# Patient Record
Sex: Female | Born: 1955 | Race: White | Hispanic: No | Marital: Married | Smoking: Former smoker
Health system: Southern US, Community
[De-identification: ages and names within clinical notes are randomized; demographics above are authoritative.]

## PROBLEM LIST (undated history)

## (undated) DIAGNOSIS — K219 Gastro-esophageal reflux disease without esophagitis: Secondary | ICD-10-CM

## (undated) DIAGNOSIS — R112 Nausea with vomiting, unspecified: Secondary | ICD-10-CM

## (undated) DIAGNOSIS — Z8719 Personal history of other diseases of the digestive system: Secondary | ICD-10-CM

## (undated) DIAGNOSIS — Z9889 Other specified postprocedural states: Secondary | ICD-10-CM

## (undated) DIAGNOSIS — C801 Malignant (primary) neoplasm, unspecified: Secondary | ICD-10-CM

## (undated) HISTORY — PX: BUNIONECTOMY: SHX129

## (undated) HISTORY — PX: TUBAL LIGATION: SHX77

## (undated) HISTORY — PX: ABDOMINAL HYSTERECTOMY: SHX81

---

## 1985-02-27 DIAGNOSIS — C801 Malignant (primary) neoplasm, unspecified: Secondary | ICD-10-CM

## 1985-02-27 HISTORY — DX: Malignant (primary) neoplasm, unspecified: C80.1

## 2008-10-19 ENCOUNTER — Emergency Department (HOSPITAL_COMMUNITY): Admission: EM | Admit: 2008-10-19 | Discharge: 2008-10-19 | Payer: Self-pay | Admitting: Emergency Medicine

## 2010-03-16 ENCOUNTER — Encounter
Admission: RE | Admit: 2010-03-16 | Discharge: 2010-03-16 | Payer: Self-pay | Source: Home / Self Care | Attending: Specialist | Admitting: Specialist

## 2010-04-04 ENCOUNTER — Other Ambulatory Visit: Payer: Self-pay | Admitting: Specialist

## 2010-04-04 DIAGNOSIS — R928 Other abnormal and inconclusive findings on diagnostic imaging of breast: Secondary | ICD-10-CM

## 2010-04-08 ENCOUNTER — Ambulatory Visit
Admission: RE | Admit: 2010-04-08 | Discharge: 2010-04-08 | Disposition: A | Discharge status: Home / Self Care | Payer: Commercial Managed Care - PPO | Source: Ambulatory Visit | Attending: Specialist | Admitting: Specialist

## 2010-04-08 DIAGNOSIS — R928 Other abnormal and inconclusive findings on diagnostic imaging of breast: Secondary | ICD-10-CM

## 2014-09-04 ENCOUNTER — Encounter (INDEPENDENT_AMBULATORY_CARE_PROVIDER_SITE_OTHER): Payer: Self-pay | Admitting: *Deleted

## 2014-09-17 ENCOUNTER — Other Ambulatory Visit (INDEPENDENT_AMBULATORY_CARE_PROVIDER_SITE_OTHER): Payer: Self-pay | Admitting: *Deleted

## 2014-09-17 ENCOUNTER — Encounter (INDEPENDENT_AMBULATORY_CARE_PROVIDER_SITE_OTHER): Payer: Self-pay | Admitting: *Deleted

## 2014-09-17 DIAGNOSIS — Z1211 Encounter for screening for malignant neoplasm of colon: Secondary | ICD-10-CM

## 2014-10-29 ENCOUNTER — Telehealth (INDEPENDENT_AMBULATORY_CARE_PROVIDER_SITE_OTHER): Payer: Self-pay | Admitting: *Deleted

## 2014-10-29 MED ORDER — SUPREP BOWEL PREP KIT 17.5-3.13-1.6 GM/177ML PO SOLN: 1.0000 | Freq: Once | ORAL | Status: DC

## 2014-10-29 NOTE — Telephone Encounter (Signed)
Patient needs suprep 

## 2014-11-17 ENCOUNTER — Telehealth (INDEPENDENT_AMBULATORY_CARE_PROVIDER_SITE_OTHER): Payer: Self-pay | Admitting: *Deleted

## 2014-11-17 NOTE — Telephone Encounter (Signed)
Referring MD/PCP: Macarthur Critchley   Procedure: tcs  Reason/Indication:  screening  Has patient had this procedure before?  no  If so, when, by whom and where?    Is there a family history of colon cancer?  no  Who?  What age when diagnosed?    Is patient diabetic?   no      Does patient have prosthetic heart valve?  no  Do you have a pacemaker?  no  Has patient ever had endocarditis? no  Has patient had joint replacement within last 12 months?  no  Does patient tend to be constipated or take laxatives? sometime  Does patient have a history of alcohol/drug use? no  Is patient on Coumadin, Plavix and/or Aspirin? no  Medications: fluticasone nasal spray daily, amiben 5 mg 1/2 tab nightly, zantac 150 mg bid, stool softener nightly, claritin 10 mg daily, advil prn, omega red krill oil daily, red yeast rice bid  Allergies: cholesterol medications, N-SAIDS  Medication Adjustment:   Procedure date & time: 12/09/14 at 930

## 2014-11-17 NOTE — Telephone Encounter (Signed)
agree

## 2014-12-09 ENCOUNTER — Ambulatory Visit (HOSPITAL_COMMUNITY)
Admission: RE | Admit: 2014-12-09 | Discharge: 2014-12-09 | Disposition: A | Discharge status: Home / Self Care | Payer: 59 | Source: Ambulatory Visit | Attending: Internal Medicine | Admitting: Internal Medicine

## 2014-12-09 ENCOUNTER — Encounter (HOSPITAL_COMMUNITY): Payer: Self-pay | Admitting: *Deleted

## 2014-12-09 ENCOUNTER — Encounter (HOSPITAL_COMMUNITY)
Admission: RE | Disposition: A | Discharge status: Home / Self Care | Payer: Self-pay | Source: Ambulatory Visit | Attending: Internal Medicine

## 2014-12-09 DIAGNOSIS — Z79899 Other long term (current) drug therapy: Secondary | ICD-10-CM | POA: Insufficient documentation

## 2014-12-09 DIAGNOSIS — Z1211 Encounter for screening for malignant neoplasm of colon: Secondary | ICD-10-CM | POA: Insufficient documentation

## 2014-12-09 DIAGNOSIS — K648 Other hemorrhoids: Secondary | ICD-10-CM | POA: Insufficient documentation

## 2014-12-09 DIAGNOSIS — K9171 Accidental puncture and laceration of a digestive system organ or structure during a digestive system procedure: Secondary | ICD-10-CM | POA: Diagnosis not present

## 2014-12-09 DIAGNOSIS — K219 Gastro-esophageal reflux disease without esophagitis: Secondary | ICD-10-CM | POA: Diagnosis not present

## 2014-12-09 DIAGNOSIS — Z809 Family history of malignant neoplasm, unspecified: Secondary | ICD-10-CM | POA: Diagnosis not present

## 2014-12-09 DIAGNOSIS — K644 Residual hemorrhoidal skin tags: Secondary | ICD-10-CM | POA: Diagnosis not present

## 2014-12-09 DIAGNOSIS — Z87891 Personal history of nicotine dependence: Secondary | ICD-10-CM | POA: Diagnosis not present

## 2014-12-09 HISTORY — DX: Personal history of other diseases of the digestive system: Z87.19

## 2014-12-09 HISTORY — DX: Other specified postprocedural states: Z98.890

## 2014-12-09 HISTORY — PX: COLONOSCOPY: SHX5424

## 2014-12-09 HISTORY — DX: Gastro-esophageal reflux disease without esophagitis: K21.9

## 2014-12-09 HISTORY — DX: Malignant (primary) neoplasm, unspecified: C80.1

## 2014-12-09 HISTORY — DX: Nausea with vomiting, unspecified: R11.2

## 2014-12-09 SURGERY — COLONOSCOPY
Anesthesia: Moderate Sedation

## 2014-12-09 MED ORDER — MIDAZOLAM HCL 5 MG/5ML IJ SOLN
INTRAMUSCULAR | Status: DC | PRN
  Administered 2014-12-09: 3 mg via INTRAVENOUS
  Administered 2014-12-09 (×3): 2 mg via INTRAVENOUS
  Administered 2014-12-09: 1 mg via INTRAVENOUS

## 2014-12-09 MED ORDER — MIDAZOLAM HCL 5 MG/5ML IJ SOLN
INTRAMUSCULAR | Status: AC
  Filled 2014-12-09: qty 10

## 2014-12-09 MED ORDER — MEPERIDINE HCL 50 MG/ML IJ SOLN
INTRAMUSCULAR | Status: DC | PRN
  Administered 2014-12-09 (×2): 25 mg via INTRAVENOUS

## 2014-12-09 MED ORDER — SODIUM CHLORIDE 0.9 % IV SOLN
INTRAVENOUS | Status: DC
  Administered 2014-12-09: 1000 mL via INTRAVENOUS

## 2014-12-09 MED ORDER — MEPERIDINE HCL 50 MG/ML IJ SOLN
INTRAMUSCULAR | Status: AC
  Filled 2014-12-09: qty 1

## 2014-12-09 MED ORDER — STERILE WATER FOR IRRIGATION IR SOLN
Status: DC | PRN
  Administered 2014-12-09: 09:00:00

## 2014-12-09 NOTE — Op Note (Signed)
COLONOSCOPY PROCEDURE REPORT  PATIENT:  Ashley Mccoy  MR#:  [id number] Birthdate:  1955-10-08, 59 y.o., female Endoscopist:  Dr. Rogene Houston, MD Referred By:  Dr. Earney Mallet, MD Procedure Date: 12/09/2014  Procedure:   Colonoscopy  Indications:  Patient is 59 year old Caucasian female was undergoing average risk screening colonoscopy.  Informed Consent:  The procedure and risks were reviewed with the patient and informed consent was obtained.  Medications:  Demerol 50 mg IV Versed 10 mg IV  Description of procedure:  After a digital rectal exam was performed, that colonoscope was advanced from the anus through the rectum and colon to the area of the cecum, ileocecal valve and appendiceal orifice. The cecum was deeply intubated. These structures were well-seen and photographed for the record. From the level of the cecum and ileocecal valve, the scope was slowly and cautiously withdrawn. The mucosal surfaces were carefully surveyed utilizing scope tip to flexion to facilitate fold flattening as needed. The scope was pulled down into the rectum where a thorough exam including retroflexion was performed.  Findings:   Prep satisfactory. Normal mucosa of cecum. Focal mucosal injury to cecal mucosa secondary to suction with oozing which stopped with pressure him cold biopsy forceps for three minutes. Normal mucosa of rest of the colon and rectum. Small hemorrhoids above and below the dentate line.    Therapeutic/Diagnostic Maneuvers Performed:  None  Complications:  None  EBL: minimal  Cecal Withdrawal Time:  18 minutes  Impression:  Examination performed to cecum. Small internal and external hemorrhoids otherwise normal colonoscopy. Focal suction injury to cecal mucosa which did not require any intervention.  Recommendations:  Standard instructions given. No aspirin or NSAIDs for 3 days. Next screening exam in 10 years  REHMAN,NAJEEB U  12/09/2014 10:06 AM  CC:  Dr. Earney Mallet, MD & Dr. Rayne Du ref. provider found

## 2014-12-09 NOTE — Discharge Instructions (Signed)
No aspirin or NSAIDs for 3 days. Resume usual medications and diet. No driving for 24 hours. Next screening exam in 10 years.     Colonoscopy, Care After These instructions give you information on caring for yourself after your procedure. Your doctor may also give you more specific instructions. Call your doctor if you have any problems or questions after your procedure. HOME CARE  Do not drive for 24 hours.  Do not sign important papers or use machinery for 24 hours.  You may shower.  You may go back to your usual activities, but go slower for the first 24 hours.  Take rest breaks often during the first 24 hours.  Walk around or use warm packs on your belly (abdomen) if you have belly cramping or gas.  Drink enough fluids to keep your pee (urine) clear or pale yellow.  Resume your normal diet. Avoid heavy or fried foods.  Avoid drinking alcohol for 24 hours or as told by your doctor.  Only take medicines as told by your doctor. If a tissue sample (biopsy) was taken during the procedure:   Do not take aspirin or blood thinners for 7 days, or as told by your doctor.  Do not drink alcohol for 7 days, or as told by your doctor.  Eat soft foods for the first 24 hours. GET HELP IF: You still have a small amount of blood in your poop (stool) 2-3 days after the procedure. GET HELP RIGHT AWAY IF:  You have more than a small amount of blood in your poop.  You see clumps of tissue (blood clots) in your poop.  Your belly is puffy (swollen).  You feel sick to your stomach (nauseous) or throw up (vomit).  You have a fever.  You have belly pain that gets worse and medicine does not help. MAKE SURE YOU:  Understand these instructions.  Will watch your condition.  Will get help right away if you are not doing well or get worse.   This information is not intended to replace advice given to you by your health care provider. Make sure you discuss any questions you have with  your health care provider.   Document Released: 03/18/2010 Document Revised: 02/18/2013 Document Reviewed: 10/21/2012 Elsevier Interactive Patient Education 2016 [employer].   Hemorrhoids Hemorrhoids are swollen veins around the rectum or anus. There are two types of hemorrhoids:   Internal hemorrhoids. These occur in the veins just inside the rectum. They may poke through to the outside and become irritated and painful.  External hemorrhoids. These occur in the veins outside the anus and can be felt as a painful swelling or hard lump near the anus. CAUSES  Pregnancy.   Obesity.   Constipation or diarrhea.   Straining to have a bowel movement.   Sitting for long periods on the toilet.  Heavy lifting or other activity that caused you to strain.  Anal intercourse. SYMPTOMS   Pain.   Anal itching or irritation.   Rectal bleeding.   Fecal leakage.   Anal swelling.   One or more lumps around the anus.  DIAGNOSIS  Your caregiver may be able to diagnose hemorrhoids by visual examination. Other examinations or tests that may be performed include:   Examination of the rectal area with a gloved hand (digital rectal exam).   Examination of anal canal using a small tube (scope).   A blood test if you have lost a significant amount of blood.  A test to look  inside the colon (sigmoidoscopy or colonoscopy). TREATMENT Most hemorrhoids can be treated at home. However, if symptoms do not seem to be getting better or if you have a lot of rectal bleeding, your caregiver may perform a procedure to help make the hemorrhoids get smaller or remove them completely. Possible treatments include:   Placing a rubber band at the base of the hemorrhoid to cut off the circulation (rubber band ligation).   Injecting a chemical to shrink the hemorrhoid (sclerotherapy).   Using a tool to burn the hemorrhoid (infrared light therapy).   Surgically removing the hemorrhoid  (hemorrhoidectomy).   Stapling the hemorrhoid to block blood flow to the tissue (hemorrhoid stapling).  HOME CARE INSTRUCTIONS   Eat foods with fiber, such as whole grains, beans, nuts, fruits, and vegetables. Ask your doctor about taking products with added fiber in them (fibersupplements).  Increase fluid intake. Drink enough water and fluids to keep your urine clear or pale yellow.   Exercise regularly.   Go to the bathroom when you have the urge to have a bowel movement. Do not wait.   Avoid straining to have bowel movements.   Keep the anal area dry and clean. Use wet toilet paper or moist towelettes after a bowel movement.   Medicated creams and suppositories may be used or applied as directed.   Only take over-the-counter or prescription medicines as directed by your caregiver.   Take warm sitz baths for 15-20 minutes, 3-4 times a day to ease pain and discomfort.   Place ice packs on the hemorrhoids if they are tender and swollen. Using ice packs between sitz baths may be helpful.   Put ice in a plastic bag.   Place a towel between your skin and the bag.   Leave the ice on for 15-20 minutes, 3-4 times a day.   Do not use a donut-shaped pillow or sit on the toilet for long periods. This increases blood pooling and pain.  SEEK MEDICAL CARE IF:  You have increasing pain and swelling that is not controlled by treatment or medicine.  You have uncontrolled bleeding.  You have difficulty or you are unable to have a bowel movement.  You have pain or inflammation outside the area of the hemorrhoids. MAKE SURE YOU:  Understand these instructions.  Will watch your condition.  Will get help right away if you are not doing well or get worse.   This information is not intended to replace advice given to you by your health care provider. Make sure you discuss any questions you have with your health care provider.   Document Released: 02/11/2000 Document  Revised: 01/31/2012 Document Reviewed: 12/19/2011 Elsevier Interactive Patient Education [employer].

## 2014-12-09 NOTE — H&P (Signed)
Ashley Mccoy is an 59 y.o. female.   Chief Complaint: Patient is here for colonoscopy. HPI: She is 59 year old Caucasian female who is here for screening colonoscopy. She denies abdominal pain change in her bowel habits or rectal bleeding. She's had problems with constipation for many years is getting results with dietary measures and stool softener. Family history is negative for CRC.  Past Medical History  Diagnosis Date  . PONV (postoperative nausea and vomiting)   . GERD (gastroesophageal reflux disease)   . History of hiatal hernia   . Cancer ([place]) 1987    carcinoma situ of cervix    Past Surgical History  Procedure Laterality Date  . Abdominal hysterectomy    . Tubal ligation    . Bunionectomy Left     and bone spur of 5th digit    Family History  Problem Relation Age of Onset  . Cancer Mother   . Hiatal hernia Daughter    Social History:  reports that she quit smoking about 16 years ago. She does not have any smokeless tobacco history on file. She reports that she drinks alcohol. She reports that she does not use illicit drugs.  Allergies:  Allergies  Allergen Reactions  . Nsaids     Cannot take prescription nsaids--chest pain.  . Statins     Joint pain/stiffness    Medications Prior to Admission  Medication Sig Dispense Refill  . acidophilus (RISAQUAD) CAPS capsule Take 1 capsule by mouth at bedtime.    . B-COMPLEX-C PO Take 1 tablet by mouth daily as needed.    . Cholecalciferol (VITAMIN D3) 5000 UNITS TABS Take 1 tablet by mouth daily.    Marland Kitchen docusate sodium (COLACE) 100 MG capsule Take 100 mg by mouth at bedtime.    . fluticasone (FLONASE) 50 MCG/ACT nasal spray Place 1 spray into both nostrils daily as needed for allergies or rhinitis.    Marland Kitchen loratadine (CLARITIN) 10 MG tablet Take 10 mg by mouth daily.    . ranitidine (ZANTAC) 150 MG tablet Take 300 mg by mouth at bedtime.    Manus Gunning BOWEL PREP SOLN Take 1 kit by mouth once. 1 Bottle 0  . Vitamin D,  Ergocalciferol, (DRISDOL) 50000 UNITS CAPS capsule Take 1 capsule by mouth once a week. Takes on Saturday  0  . zolpidem (AMBIEN) 5 MG tablet Take 2.5 mg by mouth 2 (two) times a week.    . magnesium gluconate (MAGONATE) 500 MG tablet Take 500 mg by mouth daily as needed (weekly).    . triamcinolone cream (KENALOG) 0.5 % Apply 1 application topically 2 (two) times daily as needed.  0    No results found for this or any previous visit (from the past 48 hour(s)). No results found.  ROS  Blood pressure 124/75, pulse 80, temperature 97.8 F (36.6 C), temperature source Oral, resp. rate 14, height '5\' 8"'  (1.727 m), weight 169 lb (76.658 kg), SpO2 99 %. Physical Exam  Constitutional: She appears well-developed and well-nourished.  HENT:  Mouth/Throat: Oropharynx is clear and moist.  Eyes: Conjunctivae are normal. No scleral icterus.  Neck: No thyromegaly present.  Cardiovascular: Normal rate, regular rhythm and normal heart sounds.   No murmur heard. Respiratory: Effort normal and breath sounds normal.  GI: Soft. She exhibits no distension and no mass. There is no tenderness.  Musculoskeletal: She exhibits no edema.  Lymphadenopathy:    She has no cervical adenopathy.  Neurological: She is alert.  Skin: Skin is warm and  dry.     Assessment/Plan Average risk screening colonoscopy.  Ashley Mccoy U 12/09/2014, 9:23 AM

## 2014-12-15 ENCOUNTER — Encounter (HOSPITAL_COMMUNITY): Payer: Self-pay | Admitting: Internal Medicine

## 2015-08-03 DIAGNOSIS — E785 Hyperlipidemia, unspecified: Secondary | ICD-10-CM | POA: Diagnosis not present

## 2015-08-03 DIAGNOSIS — Z0001 Encounter for general adult medical examination with abnormal findings: Secondary | ICD-10-CM | POA: Diagnosis not present

## 2015-08-03 DIAGNOSIS — K219 Gastro-esophageal reflux disease without esophagitis: Secondary | ICD-10-CM | POA: Diagnosis not present

## 2015-08-03 DIAGNOSIS — F5104 Psychophysiologic insomnia: Secondary | ICD-10-CM | POA: Diagnosis not present

## 2015-08-03 DIAGNOSIS — N390 Urinary tract infection, site not specified: Secondary | ICD-10-CM | POA: Diagnosis not present

## 2015-10-19 DIAGNOSIS — D069 Carcinoma in situ of cervix, unspecified: Secondary | ICD-10-CM | POA: Diagnosis not present

## 2015-10-19 DIAGNOSIS — Z1231 Encounter for screening mammogram for malignant neoplasm of breast: Secondary | ICD-10-CM | POA: Diagnosis not present

## 2015-10-19 DIAGNOSIS — M85851 Other specified disorders of bone density and structure, right thigh: Secondary | ICD-10-CM | POA: Diagnosis not present

## 2015-10-19 DIAGNOSIS — Z87410 Personal history of cervical dysplasia: Secondary | ICD-10-CM | POA: Diagnosis not present

## 2015-10-19 DIAGNOSIS — Z01419 Encounter for gynecological examination (general) (routine) without abnormal findings: Secondary | ICD-10-CM | POA: Diagnosis not present

## 2015-12-30 DIAGNOSIS — D225 Melanocytic nevi of trunk: Secondary | ICD-10-CM | POA: Diagnosis not present

## 2015-12-30 DIAGNOSIS — L820 Inflamed seborrheic keratosis: Secondary | ICD-10-CM | POA: Diagnosis not present

## 2016-04-17 ENCOUNTER — Encounter (HOSPITAL_COMMUNITY)
Admission: EM | Disposition: A | Discharge status: Home / Self Care | Payer: Self-pay | Source: Home / Self Care | Attending: Emergency Medicine

## 2016-04-17 ENCOUNTER — Encounter (HOSPITAL_COMMUNITY): Payer: Self-pay

## 2016-04-17 ENCOUNTER — Emergency Department (HOSPITAL_COMMUNITY)
Admission: EM | Admit: 2016-04-17 | Discharge: 2016-04-17 | Disposition: A | Discharge status: Home / Self Care | Payer: 59 | Attending: General Surgery | Admitting: General Surgery

## 2016-04-17 ENCOUNTER — Emergency Department (HOSPITAL_COMMUNITY): Payer: 59 | Admitting: Anesthesiology

## 2016-04-17 ENCOUNTER — Emergency Department (HOSPITAL_COMMUNITY): Payer: 59

## 2016-04-17 DIAGNOSIS — Z7951 Long term (current) use of inhaled steroids: Secondary | ICD-10-CM | POA: Diagnosis not present

## 2016-04-17 DIAGNOSIS — Z8379 Family history of other diseases of the digestive system: Secondary | ICD-10-CM | POA: Insufficient documentation

## 2016-04-17 DIAGNOSIS — Z8541 Personal history of malignant neoplasm of cervix uteri: Secondary | ICD-10-CM | POA: Insufficient documentation

## 2016-04-17 DIAGNOSIS — Z79899 Other long term (current) drug therapy: Secondary | ICD-10-CM | POA: Insufficient documentation

## 2016-04-17 DIAGNOSIS — Z87891 Personal history of nicotine dependence: Secondary | ICD-10-CM | POA: Diagnosis not present

## 2016-04-17 DIAGNOSIS — K449 Diaphragmatic hernia without obstruction or gangrene: Secondary | ICD-10-CM | POA: Insufficient documentation

## 2016-04-17 DIAGNOSIS — Z90710 Acquired absence of both cervix and uterus: Secondary | ICD-10-CM | POA: Diagnosis not present

## 2016-04-17 DIAGNOSIS — Z886 Allergy status to analgesic agent status: Secondary | ICD-10-CM | POA: Diagnosis not present

## 2016-04-17 DIAGNOSIS — Z888 Allergy status to other drugs, medicaments and biological substances status: Secondary | ICD-10-CM | POA: Diagnosis not present

## 2016-04-17 DIAGNOSIS — R1110 Vomiting, unspecified: Secondary | ICD-10-CM | POA: Diagnosis not present

## 2016-04-17 DIAGNOSIS — K353 Acute appendicitis with localized peritonitis, without perforation or gangrene: Secondary | ICD-10-CM

## 2016-04-17 DIAGNOSIS — K3580 Unspecified acute appendicitis: Secondary | ICD-10-CM | POA: Diagnosis not present

## 2016-04-17 DIAGNOSIS — K219 Gastro-esophageal reflux disease without esophagitis: Secondary | ICD-10-CM | POA: Diagnosis not present

## 2016-04-17 HISTORY — PX: LAPAROSCOPIC APPENDECTOMY: SHX408

## 2016-04-17 LAB — COMPREHENSIVE METABOLIC PANEL
ALT: 22 U/L (ref 14–54)
AST: 23 U/L (ref 15–41)
Albumin: 4.4 g/dL (ref 3.5–5.0)
Alkaline Phosphatase: 51 U/L (ref 38–126)
Anion gap: 10 (ref 5–15)
BUN: 15 mg/dL (ref 6–20)
CO2: 26 mmol/L (ref 22–32)
Calcium: 9.6 mg/dL (ref 8.9–10.3)
Chloride: 99 mmol/L — ABNORMAL LOW (ref 101–111)
Creatinine, Ser: 0.55 mg/dL (ref 0.44–1.00)
GFR calc Af Amer: >60 mL/min (ref >60)
GFR calc non Af Amer: >60 mL/min (ref >60)
Glucose, Bld: 133 mg/dL — ABNORMAL HIGH (ref 65–99)
Potassium: 3.6 mmol/L (ref 3.5–5.1)
Sodium: 135 mmol/L (ref 135–145)
Total Bilirubin: 0.8 mg/dL (ref 0.3–1.2)
Total Protein: 8 g/dL (ref 6.5–8.1)

## 2016-04-17 LAB — URINALYSIS, ROUTINE W REFLEX MICROSCOPIC
Bilirubin Urine: NEGATIVE
Glucose, UA: NEGATIVE mg/dL
Ketones, ur: NEGATIVE mg/dL
Leukocytes, UA: NEGATIVE
Nitrite: NEGATIVE
Protein, ur: NEGATIVE mg/dL
Specific Gravity, Urine: 1.013 (ref 1.005–1.030)
pH: 8 (ref 5.0–8.0)

## 2016-04-17 LAB — CBC WITH DIFFERENTIAL/PLATELET
BASOS PCT: 0 %
Basophils Absolute: 0 10*3/uL (ref 0.0–0.1)
EOS ABS: 0 10*3/uL (ref 0.0–0.7)
EOS PCT: 0 %
HCT: 40.3 % (ref 36.0–46.0)
Hemoglobin: 13.7 g/dL (ref 12.0–15.0)
LYMPHS ABS: 1 10*3/uL (ref 0.7–4.0)
Lymphocytes Relative: 7 %
MCH: 31.1 pg (ref 26.0–34.0)
MCHC: 34 g/dL (ref 30.0–36.0)
MCV: 91.4 fL (ref 78.0–100.0)
Monocytes Absolute: 0.8 10*3/uL (ref 0.1–1.0)
Monocytes Relative: 6 %
Neutro Abs: 12.1 10*3/uL — ABNORMAL HIGH (ref 1.7–7.7)
Neutrophils Relative %: 87 %
PLATELETS: 306 10*3/uL (ref 150–400)
RBC: 4.41 MIL/uL (ref 3.87–5.11)
RDW: 13.3 % (ref 11.5–15.5)
WBC: 13.8 10*3/uL — AB (ref 4.0–10.5)

## 2016-04-17 LAB — LIPASE, BLOOD: Lipase: 15 U/L (ref 11–51)

## 2016-04-17 SURGERY — APPENDECTOMY, LAPAROSCOPIC
Anesthesia: General

## 2016-04-17 MED ORDER — NEOSTIGMINE METHYLSULFATE 10 MG/10ML IV SOLN
INTRAVENOUS | Status: DC | PRN
  Administered 2016-04-17: 3 mg via INTRAVENOUS

## 2016-04-17 MED ORDER — SUCCINYLCHOLINE 20MG/ML (10ML) SYRINGE FOR MEDFUSION PUMP - OPTIME
INTRAMUSCULAR | Status: DC | PRN
  Administered 2016-04-17: 80 mg via INTRAVENOUS

## 2016-04-17 MED ORDER — BUPIVACAINE HCL (PF) 0.5 % IJ SOLN
INTRAMUSCULAR | Status: DC | PRN
  Administered 2016-04-17: 10 mL

## 2016-04-17 MED ORDER — DEXAMETHASONE SODIUM PHOSPHATE 4 MG/ML IJ SOLN
INTRAMUSCULAR | Status: AC
  Filled 2016-04-17: qty 1

## 2016-04-17 MED ORDER — FENTANYL CITRATE (PF) 100 MCG/2ML IJ SOLN
INTRAMUSCULAR | Status: DC | PRN
  Administered 2016-04-17 (×3): 50 ug via INTRAVENOUS

## 2016-04-17 MED ORDER — LACTATED RINGERS IV SOLN
INTRAVENOUS | Status: DC
  Administered 2016-04-17: 1000 mL via INTRAVENOUS

## 2016-04-17 MED ORDER — HYDROCODONE-ACETAMINOPHEN 5-325 MG PO TABS: 1.0000 | ORAL_TABLET | ORAL | 0 refills | Status: AC | PRN

## 2016-04-17 MED ORDER — GLYCOPYRROLATE 0.2 MG/ML IJ SOLN
INTRAMUSCULAR | Status: AC
  Filled 2016-04-17: qty 1

## 2016-04-17 MED ORDER — POVIDONE-IODINE 10 % OINT PACKET
TOPICAL_OINTMENT | CUTANEOUS | Status: DC | PRN
  Administered 2016-04-17: 1 via TOPICAL

## 2016-04-17 MED ORDER — CHLORHEXIDINE GLUCONATE CLOTH 2 % EX PADS: 6.0000 | MEDICATED_PAD | Freq: Once | CUTANEOUS | Status: DC

## 2016-04-17 MED ORDER — SODIUM CHLORIDE 0.9 % IR SOLN
Status: DC | PRN
  Administered 2016-04-17: 1000 mL

## 2016-04-17 MED ORDER — SUCCINYLCHOLINE CHLORIDE 20 MG/ML IJ SOLN
INTRAMUSCULAR | Status: AC
  Filled 2016-04-17: qty 1

## 2016-04-17 MED ORDER — FAMOTIDINE IN NACL 20-0.9 MG/50ML-% IV SOLN: 20.0000 mg | INTRAVENOUS | Status: DC

## 2016-04-17 MED ORDER — IOPAMIDOL (ISOVUE-300) INJECTION 61%
100.0000 mL | Freq: Once | INTRAVENOUS | Status: AC | PRN
  Administered 2016-04-17: 100 mL via INTRAVENOUS

## 2016-04-17 MED ORDER — IOPAMIDOL (ISOVUE-300) INJECTION 61%
INTRAVENOUS | Status: AC
  Filled 2016-04-17: qty 30

## 2016-04-17 MED ORDER — ONDANSETRON HCL 4 MG/2ML IJ SOLN
INTRAMUSCULAR | Status: AC
  Filled 2016-04-17: qty 2

## 2016-04-17 MED ORDER — MIDAZOLAM HCL 2 MG/2ML IJ SOLN
INTRAMUSCULAR | Status: AC
  Filled 2016-04-17: qty 2

## 2016-04-17 MED ORDER — LIDOCAINE HCL (PF) 1 % IJ SOLN
INTRAMUSCULAR | Status: AC
Start: 2016-04-17 — End: 2016-04-17
  Filled 2016-04-17: qty 5

## 2016-04-17 MED ORDER — FENTANYL CITRATE (PF) 100 MCG/2ML IJ SOLN
INTRAMUSCULAR | Status: AC
  Filled 2016-04-17: qty 4

## 2016-04-17 MED ORDER — BUPIVACAINE HCL (PF) 0.5 % IJ SOLN
INTRAMUSCULAR | Status: AC
  Filled 2016-04-17: qty 30

## 2016-04-17 MED ORDER — ONDANSETRON HCL 4 MG/2ML IJ SOLN
4.0000 mg | Freq: Once | INTRAMUSCULAR | Status: AC
  Administered 2016-04-17: 4 mg via INTRAVENOUS

## 2016-04-17 MED ORDER — MIDAZOLAM HCL 2 MG/2ML IJ SOLN
1.0000 mg | INTRAMUSCULAR | Status: AC
  Administered 2016-04-17: 2 mg via INTRAVENOUS

## 2016-04-17 MED ORDER — GLYCOPYRROLATE 0.2 MG/ML IJ SOLN
INTRAMUSCULAR | Status: AC
  Filled 2016-04-17: qty 2

## 2016-04-17 MED ORDER — SCOPOLAMINE 1 MG/3DAYS TD PT72
MEDICATED_PATCH | TRANSDERMAL | Status: AC
  Filled 2016-04-17: qty 1

## 2016-04-17 MED ORDER — HYDROMORPHONE HCL 1 MG/ML IJ SOLN: 0.2500 mg | INTRAMUSCULAR | Status: DC | PRN

## 2016-04-17 MED ORDER — ERTAPENEM SODIUM 1 G IJ SOLR
INTRAMUSCULAR | Status: AC
  Filled 2016-04-17: qty 1

## 2016-04-17 MED ORDER — POVIDONE-IODINE 10 % EX OINT
TOPICAL_OINTMENT | CUTANEOUS | Status: AC
  Filled 2016-04-17: qty 1

## 2016-04-17 MED ORDER — PROPOFOL 10 MG/ML IV BOLUS
INTRAVENOUS | Status: AC
  Filled 2016-04-17: qty 20

## 2016-04-17 MED ORDER — ONDANSETRON HCL 4 MG/2ML IJ SOLN
4.0000 mg | Freq: Once | INTRAMUSCULAR | Status: AC
  Administered 2016-04-17: 4 mg via INTRAVENOUS
  Filled 2016-04-17: qty 2

## 2016-04-17 MED ORDER — LIDOCAINE HCL (CARDIAC) 10 MG/ML IV SOLN
INTRAVENOUS | Status: DC | PRN
  Administered 2016-04-17: 40 mg via INTRAVENOUS

## 2016-04-17 MED ORDER — SODIUM CHLORIDE 0.9 % IV SOLN
1.0000 g | INTRAVENOUS | Status: AC
  Administered 2016-04-17: 1 g via INTRAVENOUS
  Administered 2016-04-17: 13:00:00 via INTRAVENOUS

## 2016-04-17 MED ORDER — ROCURONIUM BROMIDE 50 MG/5ML IV SOLN
INTRAVENOUS | Status: AC
  Filled 2016-04-17: qty 1

## 2016-04-17 MED ORDER — HYDROMORPHONE HCL 1 MG/ML IJ SOLN
1.0000 mg | Freq: Once | INTRAMUSCULAR | Status: AC
  Administered 2016-04-17: 1 mg via INTRAVENOUS
  Filled 2016-04-17: qty 1

## 2016-04-17 MED ORDER — SCOPOLAMINE 1 MG/3DAYS TD PT72
1.0000 | MEDICATED_PATCH | Freq: Once | TRANSDERMAL | Status: DC
  Administered 2016-04-17: 1.5 mg via TRANSDERMAL

## 2016-04-17 MED ORDER — ROCURONIUM 10MG/ML (10ML) SYRINGE FOR MEDFUSION PUMP - OPTIME
INTRAVENOUS | Status: DC | PRN
  Administered 2016-04-17: 25 mg via INTRAVENOUS

## 2016-04-17 MED ORDER — PROPOFOL 10 MG/ML IV BOLUS
INTRAVENOUS | Status: DC | PRN
  Administered 2016-04-17: 150 mg via INTRAVENOUS

## 2016-04-17 MED ORDER — GLYCOPYRROLATE 0.2 MG/ML IJ SOLN
INTRAMUSCULAR | Status: DC | PRN
  Administered 2016-04-17: .6 mg via INTRAVENOUS

## 2016-04-17 MED ORDER — DEXAMETHASONE SODIUM PHOSPHATE 4 MG/ML IJ SOLN
4.0000 mg | Freq: Once | INTRAMUSCULAR | Status: AC
  Administered 2016-04-17: 4 mg via INTRAVENOUS

## 2016-04-17 SURGICAL SUPPLY — 41 items
BAG HAMPER (MISCELLANEOUS) ×3 IMPLANT
CHLORAPREP W/TINT 26ML (MISCELLANEOUS) ×3 IMPLANT
CLOTH BEACON ORANGE TIMEOUT ST (SAFETY) ×3 IMPLANT
COVER LIGHT HANDLE STERIS (MISCELLANEOUS) ×6 IMPLANT
CUTTER FLEX LINEAR 45M (STAPLE) ×3 IMPLANT
DECANTER SPIKE VIAL GLASS SM (MISCELLANEOUS) ×3 IMPLANT
ELECT REM PT RETURN 9FT ADLT (ELECTROSURGICAL) ×3
ELECTRODE REM PT RTRN 9FT ADLT (ELECTROSURGICAL) ×1 IMPLANT
EVACUATOR SMOKE 8.L (FILTER) ×3 IMPLANT
FORMALIN 10 PREFIL 120ML (MISCELLANEOUS) ×3 IMPLANT
GLOVE BIOGEL M 7.0 STRL (GLOVE) ×3 IMPLANT
GLOVE BIOGEL PI IND STRL 7.0 (GLOVE) ×3 IMPLANT
GLOVE BIOGEL PI INDICATOR 7.0 (GLOVE) ×6
GLOVE SURG SS PI 7.5 STRL IVOR (GLOVE) ×3 IMPLANT
GOWN STRL REUS W/ TWL XL LVL3 (GOWN DISPOSABLE) ×1 IMPLANT
GOWN STRL REUS W/TWL LRG LVL3 (GOWN DISPOSABLE) ×3 IMPLANT
GOWN STRL REUS W/TWL XL LVL3 (GOWN DISPOSABLE) ×2
INST SET LAPROSCOPIC AP (KITS) ×3 IMPLANT
KIT ROOM TURNOVER APOR (KITS) ×3 IMPLANT
MANIFOLD NEPTUNE II (INSTRUMENTS) ×3 IMPLANT
NEEDLE INSUFFLATION 14GA 120MM (NEEDLE) ×3 IMPLANT
NS IRRIG 1000ML POUR BTL (IV SOLUTION) ×3 IMPLANT
PACK LAP CHOLE LZT030E (CUSTOM PROCEDURE TRAY) ×3 IMPLANT
PAD ARMBOARD 7.5X6 YLW CONV (MISCELLANEOUS) ×3 IMPLANT
PENCIL HANDSWITCHING (ELECTRODE) ×3 IMPLANT
POUCH SPECIMEN RETRIEVAL 10MM (ENDOMECHANICALS) ×3 IMPLANT
RELOAD STAPLE TA45 3.5 REG BLU (ENDOMECHANICALS) ×3 IMPLANT
SET BASIN LINEN APH (SET/KITS/TRAYS/PACK) ×3 IMPLANT
SHEARS HARMONIC ACE PLUS 36CM (ENDOMECHANICALS) ×3 IMPLANT
SPONGE GAUZE 2X2 8PLY STER LF (GAUZE/BANDAGES/DRESSINGS) ×3
SPONGE GAUZE 2X2 8PLY STRL LF (GAUZE/BANDAGES/DRESSINGS) ×6 IMPLANT
STAPLER VISISTAT (STAPLE) ×3 IMPLANT
SUT VICRYL 0 UR6 27IN ABS (SUTURE) ×3 IMPLANT
TAPE CLOTH SURG 4X10 WHT LF (GAUZE/BANDAGES/DRESSINGS) ×3 IMPLANT
TRAY FOLEY CATH SILVER 16FR (SET/KITS/TRAYS/PACK) ×3 IMPLANT
TROCAR ENDO BLADELESS 11MM (ENDOMECHANICALS) ×3 IMPLANT
TROCAR ENDO BLADELESS 12MM (ENDOMECHANICALS) ×3 IMPLANT
TROCAR XCEL NON-BLD 5MMX100MML (ENDOMECHANICALS) ×3 IMPLANT
TUBING INSUFFLATION (TUBING) ×3 IMPLANT
WARMER LAPAROSCOPE (MISCELLANEOUS) ×3 IMPLANT
YANKAUER SUCT 12FT TUBE ARGYLE (SUCTIONS) ×3 IMPLANT

## 2016-04-17 NOTE — Anesthesia Procedure Notes (Signed)
Procedure Name: Intubation Date/Time: 04/17/2016 12:36 PM Performed by: Tressie Stalker E Pre-anesthesia Checklist: Patient identified, Patient being monitored, Timeout performed, Emergency Drugs available and Suction available Patient Re-evaluated:Patient Re-evaluated prior to inductionOxygen Delivery Method: Circle system utilized Preoxygenation: Pre-oxygenation with 100% oxygen Intubation Type: IV induction Ventilation: Mask ventilation without difficulty Laryngoscope Size: Miller and 2 Grade View: Grade I Tube type: Oral Tube size: 7.0 mm Number of attempts: 1 Airway Equipment and Method: Stylet Placement Confirmation: ETT inserted through vocal cords under direct vision,  positive ETCO2 and breath sounds checked- equal and bilateral Secured at: 21 cm Tube secured with: Tape Dental Injury: Teeth and Oropharynx as per pre-operative assessment

## 2016-04-17 NOTE — ED Notes (Signed)
Patient returned from CT

## 2016-04-17 NOTE — Anesthesia Postprocedure Evaluation (Signed)
Anesthesia Post Note  Patient: Ashley Mccoy  Procedure(s) Performed: Procedure(s) (LRB): APPENDECTOMY LAPAROSCOPIC (N/A)  Patient location during evaluation: PACU Anesthesia Type: General Level of consciousness: awake and alert and oriented Pain management: pain level controlled Vital Signs Assessment: post-procedure vital signs reviewed and stable Respiratory status: spontaneous breathing Cardiovascular status: stable Postop Assessment: no signs of nausea or vomiting Anesthetic complications: no     Last Vitals:  Vitals:   04/17/16 1400 04/17/16 1412  BP: 102/60 (!) 115/57  Pulse: (!) 53 72  Resp: (!) 9 14  Temp:      Last Pain:  Vitals:   04/17/16 1412  TempSrc: Oral  PainSc: 5                  Samanatha Brammer

## 2016-04-17 NOTE — Op Note (Signed)
Patient:  Ashley Mccoy  DOB:  1955/03/01  MRN:  [id number]   Preop Diagnosis:  Acute appendicitis  Postop Diagnosis:  Same  Procedure:  Laparoscopic appendectomy  Surgeon:  Aviva Signs, M.D.  Anes:  General endotracheal  Indications:  Patient is a 61 year old white female who presents with acute appendicitis. The risks and benefits of the procedure including bleeding, infection, and the possibility of an open procedure were fully explained to the patient, who gave informed consent.  Procedure note:  The patient was placed in supine position. After induction of general endotracheal anesthesia, the abdomen was prepped and draped using the usual sterile technique with DuraPrep. Surgical site confirmation was performed.  A supraumbilical incision was made down to the fascia. A Veress needle was introduced into the abdominal cavity and confirmation of placement was done using the saline drop test. The abdomen was then insufflated to 16 mmHg pressure. An 11 mm trocar was introduced into the abdominal cavity under direct visualization without difficulty. The patient was placed in deeper Trendelenburg position and an additional 12 mm trocar was placed in the suprapubic region and a 5 mm trocar was placed in left lower quadrant region. The appendix was visualized and noted to be acutely inflamed. There were some adhesions of small bowel to the anterior abdominal wall in the right lower quadrant that were lysed using the Harmonic scalpel. The mesoappendix was divided using the Harmonic scalpel. A standard Endo GIA was placed across the base the appendix and fired. The appendix was then removed using an Endo Catch bag without difficulty. The staple line was inspected and noted to be within normal limits. All fluid and air were then evacuated from the abdominal cavity prior to the removal of the trochars.  All wounds were irrigated with normal saline. All wounds were injected with 0.5% Sensorcaine.  The supraumbilical fascia was reapproximated using an 0 Vicryl interrupted suture. All skin incisions were closed using staples. Betadine ointment and dry sterile dressings were applied.  All tape and needle counts were correct at the end of procedure. Patient was extubated in the operating room and transferred to PACU in stable condition.  Complications:  None  EBL:  Minimal  Specimen:  Appendix

## 2016-04-17 NOTE — Discharge Instructions (Signed)
Laparoscopic Appendectomy, Adult, Care After °Refer to this sheet in the next few weeks. These instructions provide you with information about caring for yourself after your procedure. Your health care provider may also give you more specific instructions. Your treatment has been planned according to current medical practices, but problems sometimes occur. Call your health care provider if you have any problems or questions after your procedure. °What can I expect after the procedure? °After the procedure, it is common to have: °· A decrease in your energy level. °· Mild pain in the area where the surgical cuts (incisions) were made. °· Constipation. This can be caused by pain medicine and a decrease in your activity. ° °Follow these instructions at home: °Medicines °· Take over-the-counter and prescription medicines only as told by your health care provider. °· Do not drive for 24 hours if you received a sedative. °· Do not drive or operate heavy machinery while taking prescription pain medicine. °· If you were prescribed an antibiotic medicine, take it as told by your health care provider. Do not stop taking the antibiotic even if you start to feel better. °Activity °· For 3 weeks or as long as told by your health care provider: °? Do not lift anything that is heavier than 10 pounds (4.5 kg). °? Do not play contact sports. °· Gradually return to your normal activities. Ask your health care provider what activities are safe for you. °Bathing °· Keep your incisions clean and dry. Clean them as often as told by your health care provider: °? Gently wash the incisions with soap and water. °? Rinse the incisions with water to remove all soap. °? Pat the incisions dry with a clean towel. Do not rub the incisions. °· You may take showers after 48 hours. °· Do not take baths, swim, or use hot tubs for 2 weeks or as told by your health care provider. °Incision care °· Follow instructions from your healthcare provider about  how to take care of your incisions. Make sure you: °? Wash your hands with soap and water before you change your bandage (dressing). If soap and water are not available, use hand sanitizer. °? Change your dressing as told by your health care provider. °? Leave stitches (sutures), skin glue, or adhesive strips in place. These skin closures may need to stay in place for 2 weeks or longer. If adhesive strip edges start to loosen and curl up, you may trim the loose edges. Do not remove adhesive strips completely unless your health care provider tells you to do that. °· Check your incision areas every day for signs of infection. Check for: °? More redness, swelling, or pain. °? More fluid or blood. °? Warmth. °? Pus or a bad smell. °Other Instructions °· If you were sent home with a drain, follow instructions from your health care provider about how to care for the drain and how to empty it. °· Take deep breaths. This helps to prevent your lungs from becoming inflamed. °· To relieve and prevent constipation: °? Drink plenty of fluids. °? Eat plenty of fruits and vegetables. °· Keep all follow-up visits as told by your health care provider. This is important. °Contact a health care provider if: °· You have more redness, swelling, or pain around an incision. °· You have more fluid or blood coming from an incision. °· Your incision feels warm to the touch. °· You have pus or a bad smell coming from an incision or dressing. °· Your incision   dressing. °· Your incision edges break open after your sutures have been removed. °· You have increasing pain in your shoulders. °· You feel dizzy or you faint. °· You develop shortness of breath. °· You keep feeling nauseous or vomiting. °· You have diarrhea or you cannot control your bowel functions. °· You lose your appetite. °· You develop swelling or pain in your legs. °Get help right away if: °· You have a fever. °· You develop a rash. °· You have difficulty breathing. °· You have sharp pains in your  chest. °This information is not intended to replace advice given to you by your health care provider. Make sure you discuss any questions you have with your health care provider. °Document Released: 02/13/2005 Document Revised: 07/16/2015 Document Reviewed: 08/03/2014 °Elsevier Interactive Patient Education © 2017 [employer] ° °

## 2016-04-17 NOTE — H&P (Signed)
Ashley Mccoy is an 61 y.o. female.   Chief Complaint: Right lower quadrant abdominal pain HPI: Patient is a 61 year old white female who presented to the emergency room with a 24-hour history of worsening right lower quadrant abdominal pain. She is nauseated and has a decreased appetite. CT scan of the abdomen reveals acute appendicitis.  Past Medical History:  Diagnosis Date  . Cancer ([place]) 1987   carcinoma situ of cervix  . GERD (gastroesophageal reflux disease)   . History of hiatal hernia   . PONV (postoperative nausea and vomiting)     Past Surgical History:  Procedure Laterality Date  . ABDOMINAL HYSTERECTOMY    . BUNIONECTOMY Left    and bone spur of 5th digit  . COLONOSCOPY N/A 12/09/2014   Procedure: COLONOSCOPY;  Surgeon: Rogene Houston, MD;  [facility]: [facility];  Service: Endoscopy;  Laterality: N/A;  930  . TUBAL LIGATION      Family History  Problem Relation Age of Onset  . Cancer Mother   . Hiatal hernia Daughter    Social History:  reports that she quit smoking about 17 years ago. She has never used smokeless tobacco. She reports that she drinks alcohol. She reports that she does not use drugs.  Allergies:  Allergies  Allergen Reactions  . Nsaids     Cannot take prescription nsaids--chest pain.  . Statins     Joint pain/stiffness     (Not in a hospital admission)  Results for orders placed or performed during the hospital encounter of 04/17/16 (from the past 48 hour(s))  Urinalysis, Routine w reflex microscopic     Status: Abnormal   Collection Time: 04/17/16  8:17 AM  Result Value Ref Range   Color, Urine YELLOW YELLOW   APPearance CLEAR CLEAR   Specific Gravity, Urine 1.013 1.005 - 1.030   pH 8.0 5.0 - 8.0   Glucose, UA NEGATIVE NEGATIVE mg/dL   Hgb urine dipstick SMALL (A) NEGATIVE   Bilirubin Urine NEGATIVE NEGATIVE   Ketones, ur NEGATIVE NEGATIVE mg/dL   Protein, ur NEGATIVE NEGATIVE mg/dL   Nitrite NEGATIVE NEGATIVE   Leukocytes,  UA NEGATIVE NEGATIVE   RBC / HPF 6-30 0 - 5 RBC/hpf   WBC, UA 0-5 0 - 5 WBC/hpf   Bacteria, UA RARE (A) NONE SEEN   Mucous PRESENT   Lipase, blood     Status: None   Collection Time: 04/17/16  8:31 AM  Result Value Ref Range   Lipase 15 11 - 51 U/L  Comprehensive metabolic panel     Status: Abnormal   Collection Time: 04/17/16  8:31 AM  Result Value Ref Range   Sodium 135 135 - 145 mmol/L   Potassium 3.6 3.5 - 5.1 mmol/L   Chloride 99 (L) 101 - 111 mmol/L   CO2 26 22 - 32 mmol/L   Glucose, Bld 133 (H) 65 - 99 mg/dL   BUN 15 6 - 20 mg/dL   Creatinine, Ser 0.55 0.44 - 1.00 mg/dL   Calcium 9.6 8.9 - 10.3 mg/dL   Total Protein 8.0 6.5 - 8.1 g/dL   Albumin 4.4 3.5 - 5.0 g/dL   AST 23 15 - 41 U/L   ALT 22 14 - 54 U/L   Alkaline Phosphatase 51 38 - 126 U/L   Total Bilirubin 0.8 0.3 - 1.2 mg/dL   GFR calc non Af Amer >60 >60 mL/min   GFR calc Af Amer >60 >60 mL/min    Comment: (NOTE) The eGFR  has been calculated using the CKD EPI equation. This calculation has not been validated in all clinical situations. eGFR's persistently <60 mL/min signify possible Chronic Kidney Disease.    Anion gap 10 5 - 15  CBC with Differential/Platelet     Status: Abnormal   Collection Time: 04/17/16  8:31 AM  Result Value Ref Range   WBC 13.8 (H) 4.0 - 10.5 K/uL   RBC 4.41 3.87 - 5.11 MIL/uL   Hemoglobin 13.7 12.0 - 15.0 g/dL   HCT 40.3 36.0 - 46.0 %   MCV 91.4 78.0 - 100.0 fL   MCH 31.1 26.0 - 34.0 pg   MCHC 34.0 30.0 - 36.0 g/dL   RDW 13.3 11.5 - 15.5 %   Platelets 306 150 - 400 K/uL   Neutrophils Relative % 87 %   Neutro Abs 12.1 (H) 1.7 - 7.7 K/uL   Lymphocytes Relative 7 %   Lymphs Abs 1.0 0.7 - 4.0 K/uL   Monocytes Relative 6 %   Monocytes Absolute 0.8 0.1 - 1.0 K/uL   Eosinophils Relative 0 %   Eosinophils Absolute 0.0 0.0 - 0.7 K/uL   Basophils Relative 0 %   Basophils Absolute 0.0 0.0 - 0.1 K/uL   Ct Abdomen Pelvis W Contrast  Result Date: 04/17/2016 CLINICAL DATA:   61 year old female with a history of umbilical pain and vomiting EXAM: CT ABDOMEN AND PELVIS WITH CONTRAST TECHNIQUE: Multidetector CT imaging of the abdomen and pelvis was performed using the standard protocol following bolus administration of intravenous contrast. CONTRAST:  136m ISOVUE-300 IOPAMIDOL (ISOVUE-300) INJECTION 61% COMPARISON:  None. FINDINGS: Lower chest: No acute abnormality. Hepatobiliary: Ill-defined hypodense lesion within segment 2 with estimated diameter 2.5 cm x 1.5 cm. The lesion be comes more uniform with the adjacent liver parenchyma on the delayed images, not as well appreciated. There is another subcentimeter hypodense lesion within right liver lobe (image 22 series 2, which becomes less conspicuous on the delayed series. No arterial phase imaging is present. Unremarkable appearance of the gallbladder Pancreas: Unremarkable. No pancreatic ductal dilatation or surrounding inflammatory changes. Spleen: Calcifications of spleen compatible with prior granulomatous disease. Adrenals/Urinary Tract: Adrenal glands are unremarkable. Kidneys are normal, without renal calculi, focal lesion, or hydronephrosis. Bladder is unremarkable. Stomach/Bowel: Unremarkable appearance of stomach. Unremarkable small bowel. Minimal diverticular disease without evidence of acute diverticulitis. Dilated and fluid-filled appendix with associated inflammatory changes and calcifications at the base of the appendix. No associated abscess. Vascular/Lymphatic: No significant atherosclerotic changes. No aneurysm. No periaortic fluid. Reproductive: Surgical changes of hysterectomy Other: No evidence of hernia.  Nonspecific pelvic floor laxity. Musculoskeletal: No displaced fracture. Minimal degenerative changes of the spine. No significant degenerative changes of the hips. IMPRESSION: Acute appendicitis, with no complicating features. There are 2 separate ill-defined lesions of liver, segment 2 and segment 6. In the  absence of known malignancy, these lesions are favored to be benign, potentially perfusion anomaly, though are incompletely characterized on this 2 phase CT study. Follow-up ultrasound may be considered, or, if the patient has a known history of malignancy, liver protocol MRI would be recommended. These results were called by telephone at the time of interpretation on 04/17/2016 at 10:18 am to Dr. KWilson Singer who verbally acknowledged these results. Signed, JDulcy Fanny WEarleen Newport DO [facility] Electronically Signed   By: JCorrie MckusickD.O.   On: 04/17/2016 10:22    Review of Systems  Constitutional: Positive for malaise/fatigue.  HENT: Negative.   Eyes: Negative.   Respiratory:  Negative.   Cardiovascular: Negative.   Gastrointestinal: Positive for abdominal pain and nausea. Negative for diarrhea.  Genitourinary: Negative.   Musculoskeletal: Negative.   Skin: Negative.   Endo/Heme/Allergies: Negative.   All other systems reviewed and are negative.   Blood pressure 135/76, pulse 90, temperature 98.3 F (36.8 C), temperature source Oral, resp. rate 18, height '5\' 8"'  (1.727 m), weight 75.8 kg (167 lb), SpO2 96 %. Physical Exam  Vitals reviewed. Constitutional: She is oriented to person, place, and time. She appears well-developed and well-nourished. No distress.  HENT:  Head: Normocephalic and atraumatic.  Neck: Normal range of motion. Neck supple.  Cardiovascular: Normal rate, regular rhythm and normal heart sounds.   Respiratory: Effort normal and breath sounds normal.  GI: Soft. She exhibits no distension. There is tenderness. There is guarding. There is no rebound.  Tender in the right lower quadrant palpation. No rigidity noted.  Neurological: She is oriented to person, place, and time.  Skin: Skin is warm and dry.     Assessment/Plan Impression: Acute appendicitis Plan: Patient be taken to the operating room today for laparoscopic  appendectomy. The risks and benefits of the procedure including bleeding, infection, and the possibility of an open procedure were fully explained to the patient, who gave informed consent.  Jamesetta So, MD 04/17/2016, 10:46 AM

## 2016-04-17 NOTE — Transfer of Care (Signed)
Immediate Anesthesia Transfer of Care Note  Patient: Ashley Mccoy  Procedure(s) Performed: Procedure(s): APPENDECTOMY LAPAROSCOPIC (N/A)  Patient Location: PACU  Anesthesia Type:General  Level of Consciousness: awake and alert   Airway & Oxygen Therapy: Patient Spontanous Breathing and Patient connected to face mask oxygen  Post-op Assessment: Report given to RN  Post vital signs: Reviewed and stable  Last Vitals:  Vitals:   04/17/16 1210 04/17/16 1215  BP: (!) 102/52 (!) 106/55  Pulse:    Resp: (!) 35 (!) 24  Temp:      Last Pain:  Vitals:   04/17/16 1108  TempSrc:   PainSc: 2          Complications: No apparent anesthesia complications

## 2016-04-17 NOTE — ED Provider Notes (Signed)
[facility] DEPT Provider Note   CSN: [id number] Arrival date & time: 04/17/16  0753     History   Chief Complaint Chief Complaint  Patient presents with  . Abdominal Pain    HPI Ashley Mccoy is a 61 y.o. female.  Patient presents to the ED with a chief complaint of abdominal pain.  Onset was yesterday at about 4 pm.  She states that the symptoms have been gradually worsening.  She states that the pain radiates to her right lower abdomen.  She reports some associated vomiting.  Denies any fevers, chills, diarrhea, or dysuria.  Prior abdominal surgery includes abdominal hysterectomy.  There are no other associated symptoms.  She has not taken anything for her symptoms.   The history is provided by the patient. No language interpreter was used.    Past Medical History:  Diagnosis Date  . Cancer ([place]) 1987   carcinoma situ of cervix  . GERD (gastroesophageal reflux disease)   . History of hiatal hernia   . PONV (postoperative nausea and vomiting)     There are no active problems to display for this patient.   Past Surgical History:  Procedure Laterality Date  . ABDOMINAL HYSTERECTOMY    . BUNIONECTOMY Left    and bone spur of 5th digit  . COLONOSCOPY N/A 12/09/2014   Procedure: COLONOSCOPY;  Surgeon: Rogene Houston, MD;  Location: [facility];  Service: Endoscopy;  Laterality: N/A;  930  . TUBAL LIGATION      OB History    No data available       Home Medications    Prior to Admission medications   Medication Sig Start Date End Date Taking? Authorizing Provider  acidophilus (RISAQUAD) CAPS capsule Take 1 capsule by mouth at bedtime.    Historical Provider, MD  B-COMPLEX-C PO Take 1 tablet by mouth daily as needed.    Historical Provider, MD  Cholecalciferol (VITAMIN D3) 5000 UNITS TABS Take 1 tablet by mouth daily.    Historical Provider, MD  docusate sodium (COLACE) 100 MG capsule Take 100 mg by mouth at bedtime.    Historical Provider, MD    fluticasone (FLONASE) 50 MCG/ACT nasal spray Place 1 spray into both nostrils daily as needed for allergies or rhinitis.    Historical Provider, MD  loratadine (CLARITIN) 10 MG tablet Take 10 mg by mouth daily.    Historical Provider, MD  magnesium gluconate (MAGONATE) 500 MG tablet Take 500 mg by mouth daily as needed (weekly).    Historical Provider, MD  ranitidine (ZANTAC) 150 MG tablet Take 300 mg by mouth at bedtime.    Historical Provider, MD  triamcinolone cream (KENALOG) 0.5 % Apply 1 application topically 2 (two) times daily as needed. 11/09/14   Historical Provider, MD  Vitamin D, Ergocalciferol, (DRISDOL) 50000 UNITS CAPS capsule Take 1 capsule by mouth once a week. Takes on Saturday 10/31/14   Historical Provider, MD  zolpidem (AMBIEN) 5 MG tablet Take 2.5 mg by mouth 2 (two) times a week.    Historical Provider, MD    Family History Family History  Problem Relation Age of Onset  . Cancer Mother   . Hiatal hernia Daughter     Social History Social History  Substance Use Topics  . Smoking status: Former Smoker    Quit date: 06/09/1998  . Smokeless tobacco: Never Used  . Alcohol use Yes     Comment: rarely     Allergies   Nsaids and Statins  Review of Systems Review of Systems  Constitutional: Negative for chills and fever.  Gastrointestinal: Positive for abdominal pain, nausea and vomiting. Negative for diarrhea.  All other systems reviewed and are negative.    Physical Exam Updated Vital Signs BP 135/76 (BP Location: Left Arm)   Pulse 90   Temp 98.3 F (36.8 C) (Oral)   Resp 18   Ht 5\' 8"  (1.727 m)   Wt 75.8 kg   SpO2 96%   BMI 25.39 kg/m   Physical Exam  Constitutional: She is oriented to person, place, and time. She appears well-developed and well-nourished.  HENT:  Head: Normocephalic and atraumatic.  Eyes: Conjunctivae and EOM are normal. Pupils are equal, round, and reactive to light.  Neck: Normal range of motion. Neck supple.   Cardiovascular: Normal rate and regular rhythm.  Exam reveals no gallop and no friction rub.   No murmur heard. Pulmonary/Chest: Effort normal and breath sounds normal. No respiratory distress. She has no wheezes. She has no rales. She exhibits no tenderness.  Abdominal: Soft. Bowel sounds are normal. She exhibits no distension and no mass. There is tenderness. There is rebound and guarding.  Quite tender to palpation in RLQ  Musculoskeletal: Normal range of motion. She exhibits no edema or tenderness.  Neurological: She is alert and oriented to person, place, and time.  Skin: Skin is warm and dry.  Psychiatric: She has a normal mood and affect. Her behavior is normal. Judgment and thought content normal.  Nursing note and vitals reviewed.    ED Treatments / Results  Labs (all labs ordered are listed, but only abnormal results are displayed) Labs Reviewed  LIPASE, BLOOD  COMPREHENSIVE METABOLIC PANEL  URINALYSIS, ROUTINE W REFLEX MICROSCOPIC  CBC WITH DIFFERENTIAL/PLATELET    EKG  EKG Interpretation None       Radiology No results found.  Procedures Procedures (including critical care time)  Medications Ordered in ED Medications  HYDROmorphone (DILAUDID) injection 1 mg (not administered)  ondansetron (ZOFRAN) injection 4 mg (not administered)     Initial Impression / Assessment and Plan / ED Course  I have reviewed the triage vital signs and the nursing notes.  Pertinent labs & imaging results that were available during my care of the patient were reviewed by me and considered in my medical decision making (see chart for details).     Patient with worsening abdominal pain since yesterday.  Worse in RLQ.  Patient will need CT to rule out appy.  Hx of hysterectomy.  Mild leukocytosis with left shift.  Otherwise, labs are essentially normal.  Patient's pain improved significantly with dilaudid.  10:33 AM CT shows acute uncomplicated appy.  Discussed the case with  Dr. Arnoldo Morale from general surgery, who will come to see the patient.  Patient complains of some indigestion after drinking the contrast.  I will give her some pepcid IV.  Patient NPO.  Final Clinical Impressions(s) / ED Diagnoses   Final diagnoses:  Acute appendicitis with localized peritonitis    New Prescriptions New Prescriptions   No medications on file     Montine Circle, PA-C 04/17/16 [place], MD 04/17/16 1704

## 2016-04-17 NOTE — ED Notes (Signed)
Patient transported to CT 

## 2016-04-17 NOTE — ED Triage Notes (Signed)
Pt reports right sided umblicus pain that began 4 pm yesterday. Vomiting since 2 am.One loose stool during the night

## 2016-04-17 NOTE — Anesthesia Preprocedure Evaluation (Signed)
Anesthesia Evaluation  Patient identified by MRN, date of birth, ID band Patient awake    Reviewed: Allergy & Precautions, NPO status , Patient's Chart, lab work & pertinent test results  History of Anesthesia Complications (+) PONV  Airway Mallampati: II  TM Distance: >3 FB Neck ROM: Full    Dental  (+) Teeth Intact   Pulmonary Recent URI , Resolved, former smoker,    breath sounds clear to auscultation       Cardiovascular negative cardio ROS   Rhythm:Regular Rate:Normal     Neuro/Psych    GI/Hepatic hiatal hernia, GERD  Poorly Controlled,  Endo/Other    Renal/GU      Musculoskeletal   Abdominal   Peds  Hematology   Anesthesia Other Findings   Reproductive/Obstetrics                             Anesthesia Physical Anesthesia Plan  ASA: II  Anesthesia Plan: General   Post-op Pain Management:    Induction: Intravenous, Rapid sequence and Cricoid pressure planned  Airway Management Planned: Oral ETT  Additional Equipment:   Intra-op Plan:   Post-operative Plan: Extubation in OR  Informed Consent:   Plan Discussed with:   Anesthesia Plan Comments:         Anesthesia Quick Evaluation

## 2016-04-19 ENCOUNTER — Encounter (HOSPITAL_COMMUNITY): Payer: Self-pay | Admitting: General Surgery

## 2016-05-31 DIAGNOSIS — H9201 Otalgia, right ear: Secondary | ICD-10-CM | POA: Diagnosis not present

## 2016-06-06 DIAGNOSIS — H9201 Otalgia, right ear: Secondary | ICD-10-CM | POA: Diagnosis not present

## 2016-06-08 DIAGNOSIS — H9201 Otalgia, right ear: Secondary | ICD-10-CM | POA: Diagnosis not present

## 2016-06-08 DIAGNOSIS — B369 Superficial mycosis, unspecified: Secondary | ICD-10-CM | POA: Diagnosis not present

## 2016-06-08 DIAGNOSIS — H6241 Otitis externa in other diseases classified elsewhere, right ear: Secondary | ICD-10-CM | POA: Diagnosis not present

## 2016-06-08 DIAGNOSIS — T148XXA Other injury of unspecified body region, initial encounter: Secondary | ICD-10-CM | POA: Diagnosis not present

## 2016-06-12 DIAGNOSIS — Z760 Encounter for issue of repeat prescription: Secondary | ICD-10-CM | POA: Diagnosis not present

## 2016-06-15 DIAGNOSIS — H6241 Otitis externa in other diseases classified elsewhere, right ear: Secondary | ICD-10-CM | POA: Diagnosis not present

## 2016-06-15 DIAGNOSIS — T148XXA Other injury of unspecified body region, initial encounter: Secondary | ICD-10-CM | POA: Diagnosis not present

## 2016-06-15 DIAGNOSIS — L299 Pruritus, unspecified: Secondary | ICD-10-CM | POA: Diagnosis not present

## 2016-06-15 DIAGNOSIS — H6190 Disorder of external ear, unspecified, unspecified ear: Secondary | ICD-10-CM | POA: Diagnosis not present

## 2016-06-15 DIAGNOSIS — B369 Superficial mycosis, unspecified: Secondary | ICD-10-CM | POA: Diagnosis not present

## 2016-06-15 DIAGNOSIS — H9201 Otalgia, right ear: Secondary | ICD-10-CM | POA: Diagnosis not present

## 2016-08-08 DIAGNOSIS — E785 Hyperlipidemia, unspecified: Secondary | ICD-10-CM | POA: Diagnosis not present

## 2016-08-08 DIAGNOSIS — Z0000 Encounter for general adult medical examination without abnormal findings: Secondary | ICD-10-CM | POA: Diagnosis not present

## 2016-08-08 DIAGNOSIS — L209 Atopic dermatitis, unspecified: Secondary | ICD-10-CM | POA: Diagnosis not present

## 2016-08-08 DIAGNOSIS — N390 Urinary tract infection, site not specified: Secondary | ICD-10-CM | POA: Diagnosis not present

## 2016-08-08 DIAGNOSIS — J309 Allergic rhinitis, unspecified: Secondary | ICD-10-CM | POA: Diagnosis not present

## 2016-08-08 DIAGNOSIS — F5104 Psychophysiologic insomnia: Secondary | ICD-10-CM | POA: Diagnosis not present

## 2016-08-08 DIAGNOSIS — L237 Allergic contact dermatitis due to plants, except food: Secondary | ICD-10-CM | POA: Diagnosis not present

## 2016-10-23 DIAGNOSIS — Z1231 Encounter for screening mammogram for malignant neoplasm of breast: Secondary | ICD-10-CM | POA: Diagnosis not present

## 2016-10-23 DIAGNOSIS — Z01419 Encounter for gynecological examination (general) (routine) without abnormal findings: Secondary | ICD-10-CM | POA: Diagnosis not present

## 2016-10-23 DIAGNOSIS — D069 Carcinoma in situ of cervix, unspecified: Secondary | ICD-10-CM | POA: Diagnosis not present

## 2016-10-23 DIAGNOSIS — M85851 Other specified disorders of bone density and structure, right thigh: Secondary | ICD-10-CM | POA: Diagnosis not present

## 2016-10-23 DIAGNOSIS — Z87410 Personal history of cervical dysplasia: Secondary | ICD-10-CM | POA: Diagnosis not present

## 2017-08-03 IMAGING — CT CT ABD-PELV W/ CM
2 of 4 series · 16 of 46 positions shown, 18 images · IV contrast (Isovue)
Comparison: None.

CLINICAL DATA: 60-year-old female with a history of umbilical pain
and vomiting

EXAM:
CT ABDOMEN AND PELVIS WITH CONTRAST
TECHNIQUE: Multidetector CT imaging of the abdomen and pelvis was performed
using the standard protocol following bolus administration of
intravenous contrast.
CONTRAST:  100mL 9191BW-DII IOPAMIDOL (9191BW-DII) INJECTION 61%

[Series 2: axial st · axial · 0.90mm/px · z∈[+963,+1368]mm · 13 of 91 slices shown, 15 images]
[im 5/91  soft-tissue]
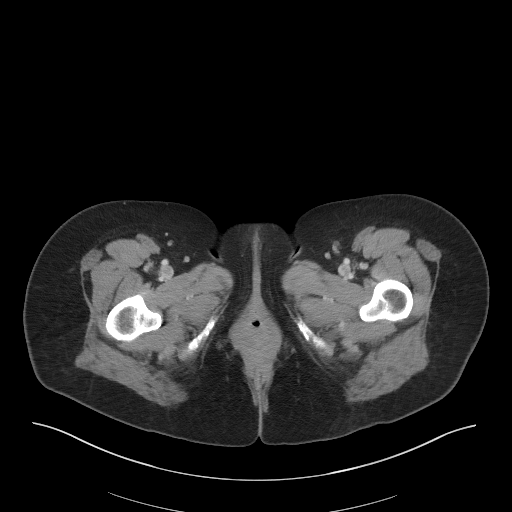
[im 5/91  bone]
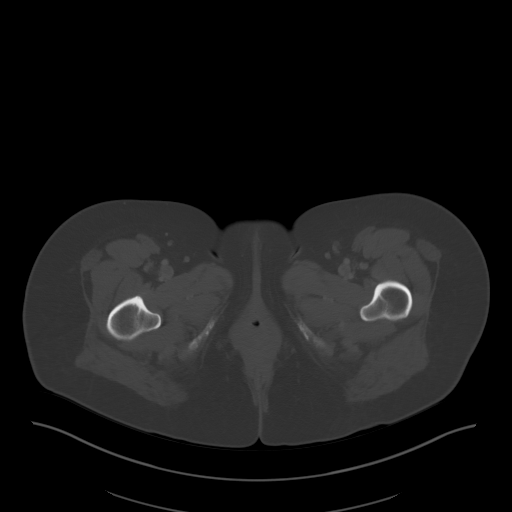
[im 14/91  soft-tissue]
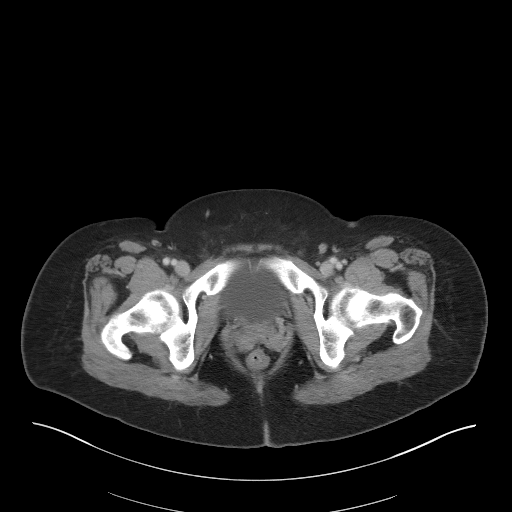
[im 19/91  soft-tissue]
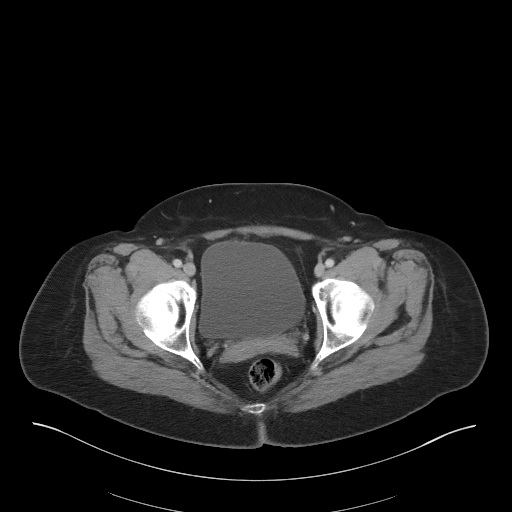
[im 28/91  soft-tissue]
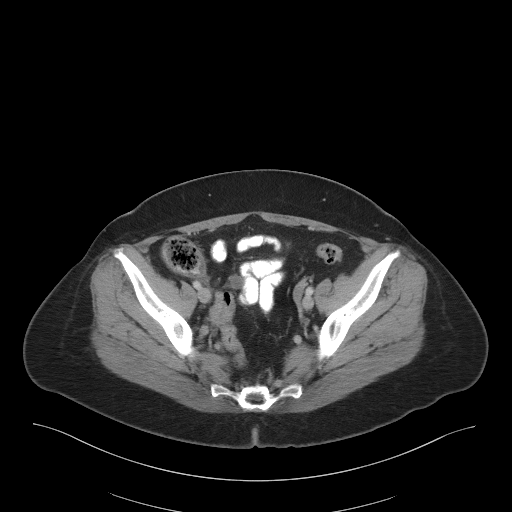
[im 32/91  soft-tissue]
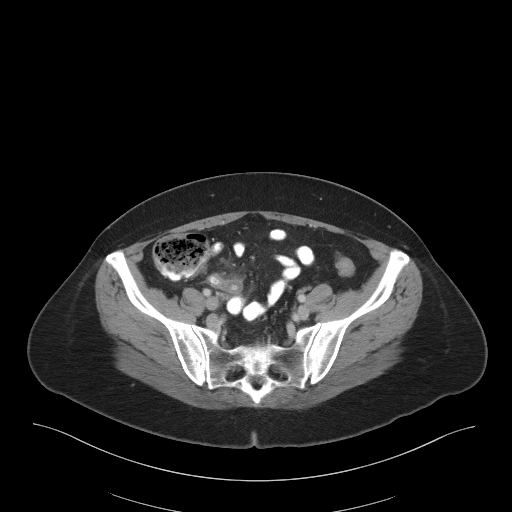
[im 41/91  soft-tissue]
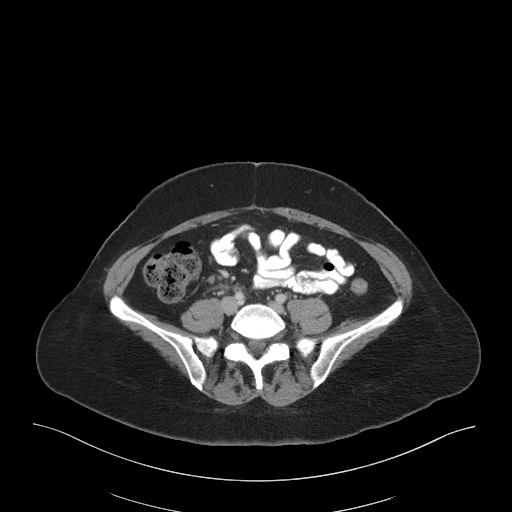
[im 46/91  soft-tissue]
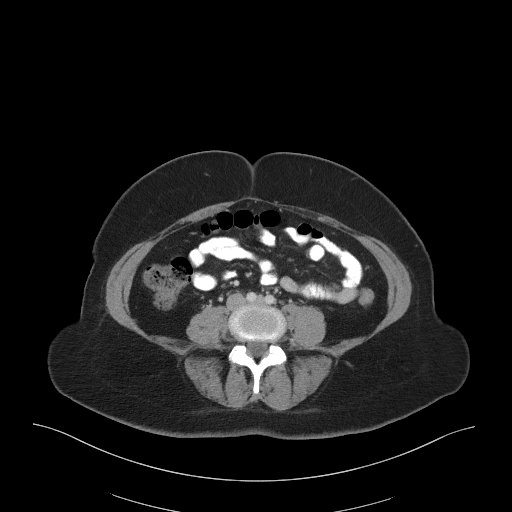
[im 50/91  soft-tissue]
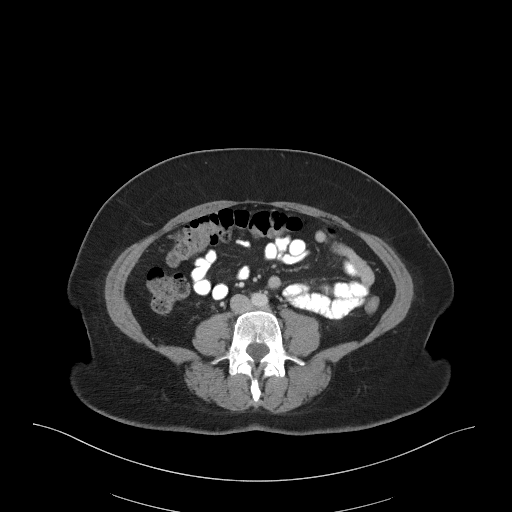
[im 59/91  soft-tissue]
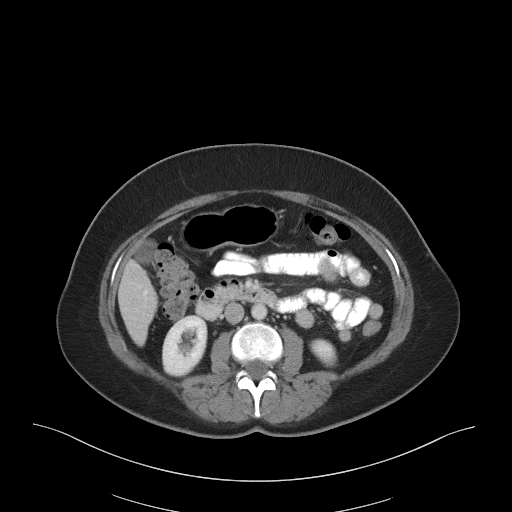
[im 59/91  bone]
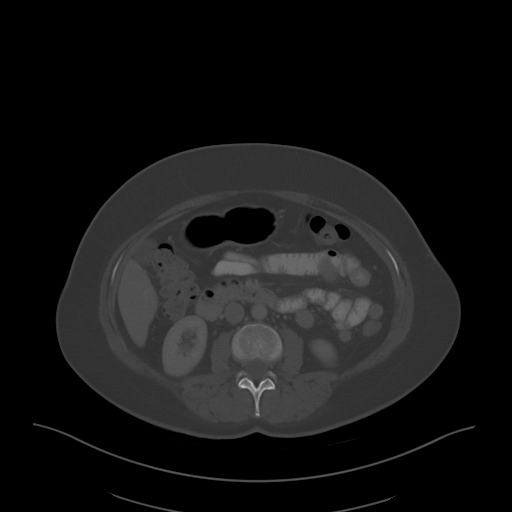
[im 64/91  soft-tissue]
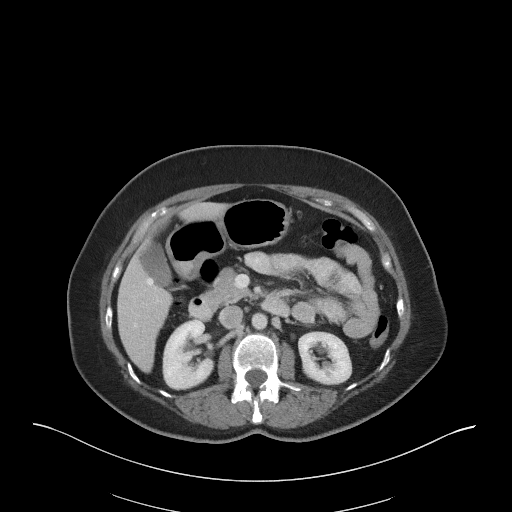
[im 73/91  soft-tissue]
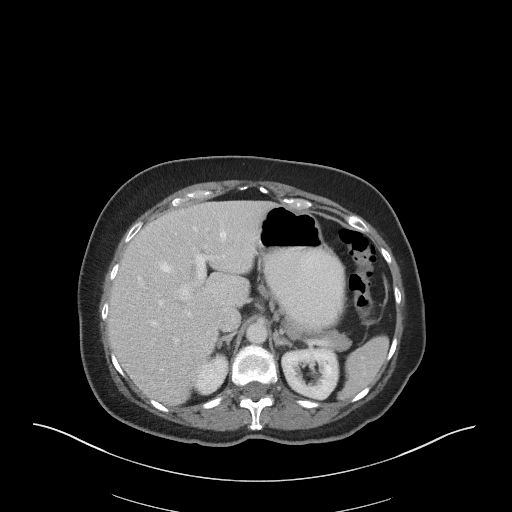
[im 77/91  soft-tissue]
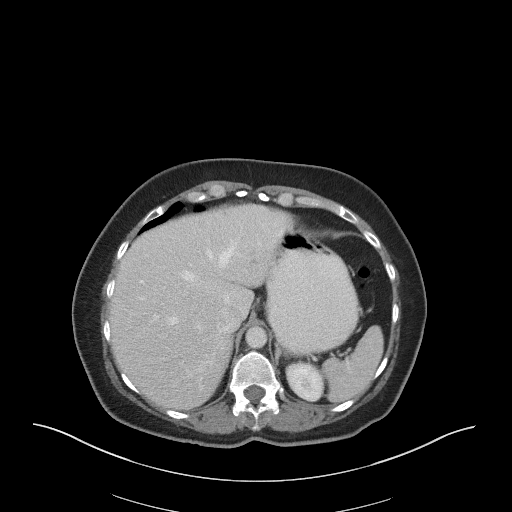
[im 86/91  soft-tissue]
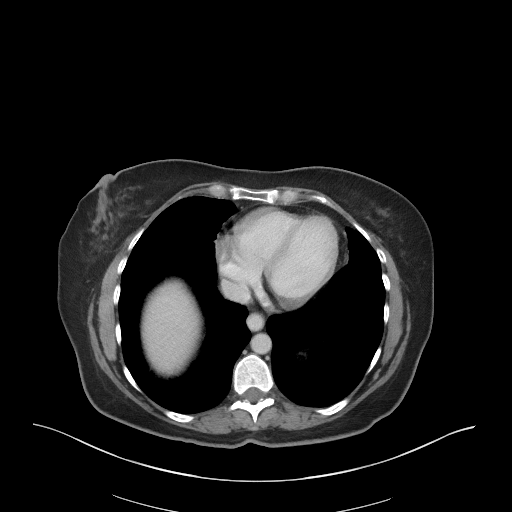

[Series 6: coronal st · coronal · 0.79mm/px · 3 of 106 slices shown]
[im 36/106  soft-tissue]
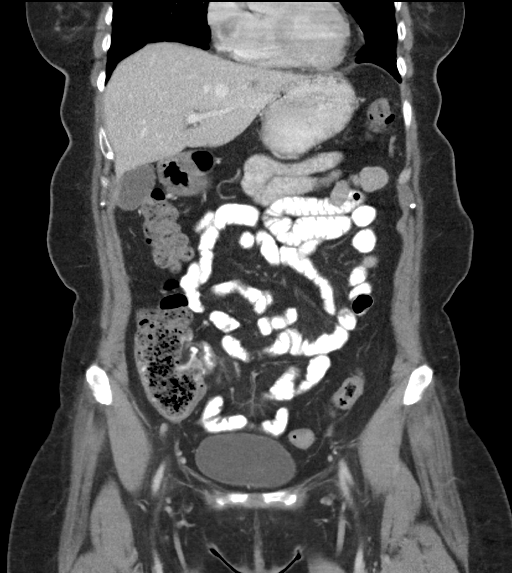
[im 47/106  soft-tissue]
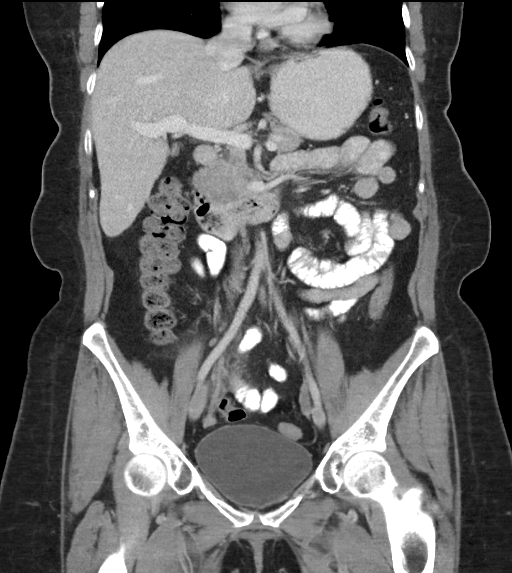
[im 59/106  soft-tissue]
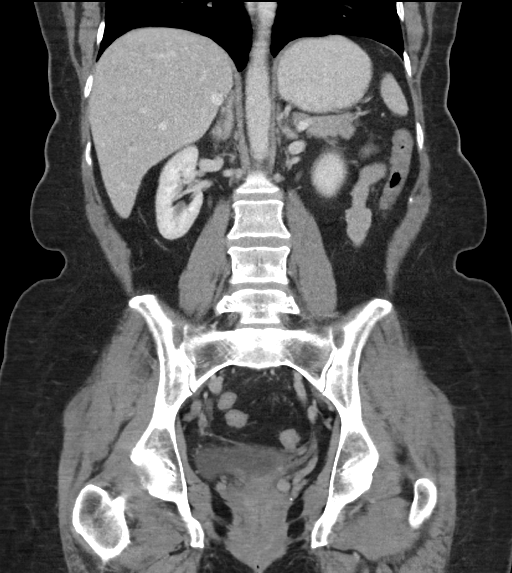

[16 of 46 positions shown; findings below may reference images not displayed]

FINDINGS: Lower chest: No acute abnormality.

Hepatobiliary: Ill-defined hypodense lesion within segment 2 with
estimated diameter 2.5 cm x 1.5 cm. The lesion be comes more uniform
with the adjacent liver parenchyma on the delayed images, not as
well appreciated.

There is another subcentimeter hypodense lesion within right liver
lobe (image 22 series 2, which becomes less conspicuous on the
delayed series. No arterial phase imaging is present.

Unremarkable appearance of the gallbladder

Pancreas: Unremarkable. No pancreatic ductal dilatation or
surrounding inflammatory changes.

Spleen: Calcifications of spleen compatible with prior granulomatous
disease.

Adrenals/Urinary Tract: Adrenal glands are unremarkable. Kidneys are
normal, without renal calculi, focal lesion, or hydronephrosis.
Bladder is unremarkable.

Stomach/Bowel: Unremarkable appearance of stomach. Unremarkable
small bowel.

Minimal diverticular disease without evidence of acute
diverticulitis.

Dilated and fluid-filled appendix with associated inflammatory
changes and calcifications at the base of the appendix. No
associated abscess.

Vascular/Lymphatic: No significant atherosclerotic changes. No
aneurysm. No periaortic fluid.

Reproductive: Surgical changes of hysterectomy

Other: No evidence of hernia.  Nonspecific pelvic floor laxity.

Musculoskeletal: No displaced fracture. Minimal degenerative changes
of the spine. No significant degenerative changes of the hips.
IMPRESSION: Acute appendicitis, with no complicating features.

There are 2 separate ill-defined lesions of liver, segment 2 and
segment 6. In the absence of known malignancy, these lesions are
favored to be benign, potentially perfusion anomaly, though are
incompletely characterized on this 2 phase CT study. Follow-up
ultrasound may be considered, or, if the patient has a known history
of malignancy, liver protocol MRI would be recommended.

These results were called by telephone at the time of interpretation
on 04/17/2016 at [DATE] to Dr. Shalonda, who verbally acknowledged
these results.

## 2017-09-06 DIAGNOSIS — Z6826 Body mass index (BMI) 26.0-26.9, adult: Secondary | ICD-10-CM | POA: Diagnosis not present

## 2017-09-06 DIAGNOSIS — R079 Chest pain, unspecified: Secondary | ICD-10-CM | POA: Diagnosis not present

## 2017-09-06 DIAGNOSIS — K219 Gastro-esophageal reflux disease without esophagitis: Secondary | ICD-10-CM | POA: Diagnosis not present

## 2017-09-27 DIAGNOSIS — R079 Chest pain, unspecified: Secondary | ICD-10-CM | POA: Diagnosis not present

## 2019-06-30 DIAGNOSIS — Z1231 Encounter for screening mammogram for malignant neoplasm of breast: Secondary | ICD-10-CM | POA: Diagnosis not present

## 2019-06-30 DIAGNOSIS — M545 Low back pain: Secondary | ICD-10-CM | POA: Diagnosis not present

## 2019-09-30 DIAGNOSIS — Z6827 Body mass index (BMI) 27.0-27.9, adult: Secondary | ICD-10-CM | POA: Diagnosis not present

## 2019-09-30 DIAGNOSIS — Z1159 Encounter for screening for other viral diseases: Secondary | ICD-10-CM | POA: Diagnosis not present

## 2019-09-30 DIAGNOSIS — Z0000 Encounter for general adult medical examination without abnormal findings: Secondary | ICD-10-CM | POA: Diagnosis not present

## 2019-11-07 DIAGNOSIS — Z1231 Encounter for screening mammogram for malignant neoplasm of breast: Secondary | ICD-10-CM | POA: Diagnosis not present

## 2020-10-08 DIAGNOSIS — G4700 Insomnia, unspecified: Secondary | ICD-10-CM | POA: Diagnosis not present

## 2020-10-08 DIAGNOSIS — E7800 Pure hypercholesterolemia, unspecified: Secondary | ICD-10-CM | POA: Diagnosis not present

## 2020-10-08 DIAGNOSIS — M8580 Other specified disorders of bone density and structure, unspecified site: Secondary | ICD-10-CM | POA: Diagnosis not present

## 2020-10-08 DIAGNOSIS — Z0000 Encounter for general adult medical examination without abnormal findings: Secondary | ICD-10-CM | POA: Diagnosis not present

## 2020-10-08 DIAGNOSIS — E559 Vitamin D deficiency, unspecified: Secondary | ICD-10-CM | POA: Diagnosis not present

## 2020-10-08 DIAGNOSIS — Z6826 Body mass index (BMI) 26.0-26.9, adult: Secondary | ICD-10-CM | POA: Diagnosis not present

## 2020-11-16 DIAGNOSIS — R1011 Right upper quadrant pain: Secondary | ICD-10-CM | POA: Diagnosis not present

## 2020-11-16 DIAGNOSIS — R079 Chest pain, unspecified: Secondary | ICD-10-CM | POA: Diagnosis not present

## 2020-11-16 DIAGNOSIS — Z87891 Personal history of nicotine dependence: Secondary | ICD-10-CM | POA: Diagnosis not present

## 2020-11-16 DIAGNOSIS — R0789 Other chest pain: Secondary | ICD-10-CM | POA: Diagnosis not present

## 2021-06-16 DIAGNOSIS — Z13820 Encounter for screening for osteoporosis: Secondary | ICD-10-CM | POA: Diagnosis not present

## 2021-06-16 DIAGNOSIS — M85851 Other specified disorders of bone density and structure, right thigh: Secondary | ICD-10-CM | POA: Diagnosis not present

## 2021-06-16 DIAGNOSIS — Z1231 Encounter for screening mammogram for malignant neoplasm of breast: Secondary | ICD-10-CM | POA: Diagnosis not present

## 2021-06-16 DIAGNOSIS — M85852 Other specified disorders of bone density and structure, left thigh: Secondary | ICD-10-CM | POA: Diagnosis not present

## 2021-10-12 DIAGNOSIS — R898 Other abnormal findings in specimens from other organs, systems and tissues: Secondary | ICD-10-CM | POA: Diagnosis not present

## 2021-10-12 DIAGNOSIS — G4700 Insomnia, unspecified: Secondary | ICD-10-CM | POA: Diagnosis not present

## 2021-10-12 DIAGNOSIS — Z23 Encounter for immunization: Secondary | ICD-10-CM | POA: Diagnosis not present

## 2021-10-12 DIAGNOSIS — Z6826 Body mass index (BMI) 26.0-26.9, adult: Secondary | ICD-10-CM | POA: Diagnosis not present

## 2021-10-12 DIAGNOSIS — Z0000 Encounter for general adult medical examination without abnormal findings: Secondary | ICD-10-CM | POA: Diagnosis not present

## 2021-10-12 DIAGNOSIS — K219 Gastro-esophageal reflux disease without esophagitis: Secondary | ICD-10-CM | POA: Diagnosis not present
